# Patient Record
Sex: Female | Born: 2000 | Race: Black or African American | Hispanic: No | Marital: Single | Smoking: Never smoker
Health system: Southern US, Community
[De-identification: ages and names within clinical notes are randomized; demographics above are authoritative.]

## PROBLEM LIST (undated history)

## (undated) DIAGNOSIS — D649 Anemia, unspecified: Secondary | ICD-10-CM

## (undated) HISTORY — DX: Anemia, unspecified: D64.9

## (undated) HISTORY — PX: BREAST SURGERY: SHX581

---
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
## 2019-05-01 ENCOUNTER — Other Ambulatory Visit: Payer: Self-pay

## 2019-05-01 ENCOUNTER — Encounter: Payer: Self-pay | Admitting: Family Medicine

## 2019-05-01 ENCOUNTER — Ambulatory Visit (INDEPENDENT_AMBULATORY_CARE_PROVIDER_SITE_OTHER): Admitting: Family Medicine

## 2019-05-01 VITALS — BP 102/64 | HR 84 | Temp 98.8°F | Resp 16 | Ht 67.0 in | Wt 153.0 lb

## 2019-05-01 DIAGNOSIS — M542 Cervicalgia: Secondary | ICD-10-CM

## 2019-05-01 DIAGNOSIS — Z0000 Encounter for general adult medical examination without abnormal findings: Secondary | ICD-10-CM

## 2019-05-01 DIAGNOSIS — M545 Low back pain, unspecified: Secondary | ICD-10-CM

## 2019-05-01 DIAGNOSIS — N62 Hypertrophy of breast: Secondary | ICD-10-CM

## 2019-05-01 DIAGNOSIS — Z0001 Encounter for general adult medical examination with abnormal findings: Secondary | ICD-10-CM

## 2019-05-01 DIAGNOSIS — L700 Acne vulgaris: Secondary | ICD-10-CM

## 2019-05-01 DIAGNOSIS — Z23 Encounter for immunization: Secondary | ICD-10-CM | POA: Diagnosis not present

## 2019-05-01 DIAGNOSIS — G8929 Other chronic pain: Secondary | ICD-10-CM

## 2019-05-01 DIAGNOSIS — D509 Iron deficiency anemia, unspecified: Secondary | ICD-10-CM

## 2019-05-01 DIAGNOSIS — Z83438 Family history of other disorder of lipoprotein metabolism and other lipidemia: Secondary | ICD-10-CM | POA: Diagnosis not present

## 2019-05-01 MED ORDER — MULTI-VITAMIN/MINERALS PO TABS: 1.0000 | ORAL_TABLET | Freq: Every day | ORAL | Status: AC

## 2019-05-01 MED ORDER — D3-1000 25 MCG (1000 UT) PO TABS: 1000.0000 [IU] | ORAL_TABLET | Freq: Every day | ORAL | Status: AC

## 2019-05-01 MED ORDER — CLINDAMYCIN PHOS-BENZOYL PEROX 1-5 % EX GEL: Freq: Two times a day (BID) | CUTANEOUS | 0 refills | Status: DC

## 2019-05-01 MED ORDER — IRON (FERROUS SULFATE) 325 (65 FE) MG PO TABS: 1.0000 | ORAL_TABLET | Freq: Two times a day (BID) | ORAL | Status: AC

## 2019-05-01 NOTE — Patient Instructions (Signed)
Consultation with plastic surgery  We will call with lab results  Okay to continue the trainor and heating pad  Get xrays done at  [facility]  Dermatology referral Try the benzaclin for your skin  Flu shot given You are due for your 3rd HPV and Hep A ( You will need to start over the adult series which is 3 shots) You have 1 Hepatitis A in your system.  F/U pending results

## 2019-05-01 NOTE — Progress Notes (Signed)
Subjective:    Patient ID: Madison Livingston, female    DOB: [birth year], 18 y.o.   MRN: [id number]  Patient presents for Establish Care (is fasting) and Shoulder Pain (x years- intermittent B shoulder pain radiating down through lower back)  Pt here to re- establish care, she has not been seen in a few years.  She had CPE done at [facility] for college last year    Reviewed immunizations she is due for Hep A and her 3rd HPV   She is currently in 2nd year at [employer] [place] and [place] regular, not sexually active   Has dentist sees every 6 months   Exercies regulary   She has chronicshoulder pain and thoracic back pain, some low back pain for many years.  No known injury to her spine. She is used topical rubs she is to use antiflu amatory she is use heating pad stretching massage peppermint oil currently using a posture trainer which helps some with her low back pain.  she sues stretches, peppermint oil, heating pad  She does have a large bust area she gets indentations frm the bra 34DDD/F  She did have labral tear right side of right shoulder playing volleyball   Acne- a year or so, she had severe break out on her face,   she tried pinterest    she does get the large acne bumps before her period starts, otherwise more fine bumps on cheeks and chin  She has a few spots on her back  She has never tried anything prescription    She is concerned about anemia.  She was unable to donate blood they told her that her iron level is low.  She started taking iron on her own in about 2 weeks ago went up to 325 twice a day.  She does have family history of anemia in her sister.   Review Of Systems:  GEN- denies fatigue, fever, weight loss,weakness, recent illness HEENT- denies eye drainage, change in vision, nasal discharge, CVS- denies chest pain, palpitations RESP- denies SOB, cough, wheeze ABD- denies N/V, change in stools, abd pain GU- denies dysuria,  hematuria, dribbling, incontinence MSK- + joint pain, muscle aches, injury Neuro- denies headache, dizziness, syncope, seizure activity       Objective:    BP 102/64   Pulse 84   Temp 98.8 F (37.1 C) (Oral)   Resp 16   Ht 5\' 7"  (1.702 m)   Wt 153 lb (69.4 kg)   LMP 04/20/2019 Comment: regular  SpO2 98%   BMI 23.96 kg/m  GEN- NAD, alert and oriented x3 HEENT- PERRL, EOMI, non injected sclera, pink conjunctiva, MMM, oropharynx clear Neck- Supple, no thyromegaly CVS- RRR, no murmur RESP-CTAB ABD-NABS,soft,NT,ND Mild acne with hyperpigmented scarring on her cheeks Musculoskeletal good range of C-spine thoracic and lumbar spine.  Negative straight leg raise.  Tenderness to palpation along the cervical and thoracic spine.  Indentations in bilateral shoulders from her bra.  Larger bust size  Psych- normal affect and mood EXT- No edema Pulses- Radial 2+        Assessment & Plan:      Problem List Items Addressed This Visit      Unprioritized   Iron deficiency anemia   Relevant Medications   Iron, Ferrous Sulfate, 325 (65 Fe) MG TABS   Other Relevant Orders   CBC with Differential/Platelet   Iron, TIBC and Ferritin Panel    Other Visit Diagnoses    Routine  general medical examination at a health care facility    -  Primary   CPE done, check labs and iron stores, Flu shot given, discussed HEP A/HPV #3 due, with HEP A since she had 1, unless she is traveling out[place] of [place] does not need at this time She wanted to defer #3 HPV today   Relevant Orders   Comprehensive metabolic panel   Lipid panel   Family history of hyperlipidemia       Relevant Orders   Lipid panel   Chronic neck pain       Obtain xray of spine to f/u any spinal disorder of bone, I think her pain in general is from her larger bust size on her small frame, and I recommend consult plastics    Relevant Orders   DG Cervical Spine Complete   Chronic bilateral low back pain without sciatica        Relevant Orders   DG Lumbar Spine Complete   DG Thoracic Spine W/Swimmers   Large breasts       Relevant Orders   DG Cervical Spine Complete   DG Lumbar Spine Complete   Acne vulgaris       Benzclin given, referral to dermatology at request   Relevant Medications   clindamycin-benzoyl peroxide (BENZACLIN) gel   Other Relevant Orders   Ambulatory referral to Dermatology   Need for immunization against influenza       Relevant Orders   Flu Vaccine QUAD 36+ mos IM (Completed)      Note: This dictation was prepared with Dragon dictation along with smaller phrase technology. Any transcriptional errors that result from this process are unintentional.

## 2019-05-02 ENCOUNTER — Ambulatory Visit
Admission: RE | Admit: 2019-05-02 | Discharge: 2019-05-02 | Disposition: A | Discharge status: Home / Self Care | Source: Ambulatory Visit | Attending: Family Medicine | Admitting: Family Medicine

## 2019-05-02 DIAGNOSIS — G8929 Other chronic pain: Secondary | ICD-10-CM

## 2019-05-02 DIAGNOSIS — N62 Hypertrophy of breast: Secondary | ICD-10-CM

## 2019-05-02 DIAGNOSIS — M545 Low back pain, unspecified: Secondary | ICD-10-CM

## 2019-05-02 DIAGNOSIS — M542 Cervicalgia: Secondary | ICD-10-CM

## 2019-05-02 LAB — CBC WITH DIFFERENTIAL/PLATELET
Absolute Monocytes: 329 cells/uL (ref 200–950)
Basophils Absolute: 32 cells/uL (ref 0–200)
Basophils Relative: 0.7 %
Eosinophils Absolute: 108 cells/uL (ref 15–500)
Eosinophils Relative: 2.4 %
HCT: 42.3 % (ref 35.0–45.0)
Hemoglobin: 14.1 g/dL (ref 11.7–15.5)
Lymphs Abs: 2597 cells/uL (ref 850–3900)
MCH: 29.4 pg (ref 27.0–33.0)
MCHC: 33.3 g/dL (ref 32.0–36.0)
MCV: 88.1 fL (ref 80.0–100.0)
MPV: 12 fL (ref 7.5–12.5)
Monocytes Relative: 7.3 %
Neutro Abs: 1436 cells/uL — ABNORMAL LOW (ref 1500–7800)
Neutrophils Relative %: 31.9 %
Platelets: 251 10*3/uL (ref 140–400)
RBC: 4.8 10*6/uL (ref 3.80–5.10)
RDW: 11.8 % (ref 11.0–15.0)
Total Lymphocyte: 57.7 %
WBC: 4.5 10*3/uL (ref 3.8–10.8)

## 2019-05-02 LAB — IRON,TIBC AND FERRITIN PANEL
%SAT: 54 % (calc) — ABNORMAL HIGH (ref 15–45)
Ferritin: 43 ng/mL (ref 16–154)
Iron: 181 ug/dL — ABNORMAL HIGH (ref 27–164)
TIBC: 338 mcg/dL (calc) (ref 271–448)

## 2019-05-02 LAB — LIPID PANEL
Cholesterol: 200 mg/dL — ABNORMAL HIGH (ref <170)
HDL: 64 mg/dL (ref >45)
LDL Cholesterol (Calc): 120 mg/dL (calc) — ABNORMAL HIGH (ref <110)
Non-HDL Cholesterol (Calc): 136 mg/dL (calc) — ABNORMAL HIGH (ref <120)
Total CHOL/HDL Ratio: 3.1 (calc) (ref <5.0)
Triglycerides: 72 mg/dL (ref <90)

## 2019-05-02 LAB — COMPREHENSIVE METABOLIC PANEL
AG Ratio: 1.5 (calc) (ref 1.0–2.5)
ALT: 12 U/L (ref 5–32)
AST: 15 U/L (ref 12–32)
Albumin: 4.5 g/dL (ref 3.6–5.1)
Alkaline phosphatase (APISO): 46 U/L (ref 36–128)
BUN: 9 mg/dL (ref 7–20)
CO2: 27 mmol/L (ref 20–32)
Calcium: 10.2 mg/dL (ref 8.9–10.4)
Chloride: 103 mmol/L (ref 98–110)
Creat: 0.85 mg/dL (ref 0.50–1.00)
Globulin: 3.1 g/dL (calc) (ref 2.0–3.8)
Glucose, Bld: 83 mg/dL (ref 65–99)
Potassium: 4.2 mmol/L (ref 3.8–5.1)
Sodium: 140 mmol/L (ref 135–146)
Total Bilirubin: 0.4 mg/dL (ref 0.2–1.1)
Total Protein: 7.6 g/dL (ref 6.3–8.2)

## 2019-05-03 NOTE — Addendum Note (Signed)
Addended by: Vic Blackbird F on: 05/03/2019 04:20 PM   Modules accepted: Orders

## 2019-10-31 ENCOUNTER — Ambulatory Visit (INDEPENDENT_AMBULATORY_CARE_PROVIDER_SITE_OTHER): Admitting: Family Medicine

## 2019-10-31 ENCOUNTER — Other Ambulatory Visit: Payer: Self-pay

## 2019-10-31 VITALS — BP 118/80 | HR 94 | Temp 97.6°F | Resp 18 | Ht 67.0 in | Wt 143.4 lb

## 2019-10-31 DIAGNOSIS — N907 Vulvar cyst: Secondary | ICD-10-CM | POA: Diagnosis not present

## 2019-10-31 DIAGNOSIS — Z23 Encounter for immunization: Secondary | ICD-10-CM

## 2019-10-31 NOTE — Patient Instructions (Addendum)
Referral to GYN F/u as needed  [facility]

## 2019-10-31 NOTE — Progress Notes (Signed)
   Subjective:    Patient ID: Madison Livingston, female    DOB: January 11, 2001, 19 y.o.   MRN: [id number]  Patient presents for Mass (in groin area, x3 years, itches sometimes) Patient here with a knot in her vulvar region for years. He was seen by [facility] dermatology. Was told that she needed to have excised by GYN to come back to my office. They did give her clindamycin topical solution which she has been using. It was recently flared up but the solution did help. She has had some drainage on and off from it when it flares up it is quite tender especially when it rubs near the panty line. No lesions on her skin. Currently it has not flared up.    Review Of Systems:  GEN- denies fatigue, fever, weight loss,weakness, recent illness HEENT- denies eye drainage, change in vision, nasal discharge, CVS- denies chest pain, palpitations RESP- denies SOB, cough, wheeze ABD- denies N/V, change in stools, abd pain GU- denies dysuria, hematuria, dribbling, incontinence MSK- denies joint pain, muscle aches, injury Neuro- denies headache, dizziness, syncope, seizure activity       Objective:    BP 118/80 (BP Location: Right Arm, Patient Position: Sitting, Cuff Size: Normal)   Pulse 94   Temp 97.6 F (36.4 C) (Temporal)   Resp 18   Ht 5\' 7"  (1.702 m)   Wt 143 lb 6.4 oz (65 kg)   SpO2 99%   BMI 22.46 kg/m  GEN- NAD, alert and oriented x3 Neck- Supple, no thyromegaly CVS- RRR, no murmur RESP-CTAB Skin- right labial- small indurated circular area palpated, NT, no fluctuance        Assessment & Plan:      Problem List Items Addressed This Visit    None    Visit Diagnoses    Labial cyst    -  Primary   Small labial cyst that keeps getting inflamed. Will defer to GYN due to location. She can continue topical clindamycin she would like to have removed    Relevant Orders   Ambulatory referral to Obstetrics / Gynecology   Need for vaccination       Relevant Orders   HPV 9-valent  vaccine,Recombinat (Completed)      Note: This dictation was prepared with Dragon dictation along with smaller phrase technology. Any transcriptional errors that result from this process are unintentional.

## 2019-11-01 ENCOUNTER — Encounter: Payer: Self-pay | Admitting: Family Medicine

## 2020-07-24 ENCOUNTER — Ambulatory Visit (INDEPENDENT_AMBULATORY_CARE_PROVIDER_SITE_OTHER): Admitting: Family Medicine

## 2020-07-24 ENCOUNTER — Other Ambulatory Visit: Payer: Self-pay

## 2020-07-24 ENCOUNTER — Encounter: Payer: Self-pay | Admitting: Family Medicine

## 2020-07-24 VITALS — BP 102/60 | HR 98 | Temp 98.2°F | Resp 14 | Ht 67.0 in | Wt 140.0 lb

## 2020-07-24 DIAGNOSIS — Z113 Encounter for screening for infections with a predominantly sexual mode of transmission: Secondary | ICD-10-CM | POA: Diagnosis not present

## 2020-07-24 DIAGNOSIS — R634 Abnormal weight loss: Secondary | ICD-10-CM

## 2020-07-24 DIAGNOSIS — F439 Reaction to severe stress, unspecified: Secondary | ICD-10-CM

## 2020-07-24 DIAGNOSIS — Z0001 Encounter for general adult medical examination with abnormal findings: Secondary | ICD-10-CM | POA: Diagnosis not present

## 2020-07-24 DIAGNOSIS — Z0000 Encounter for general adult medical examination without abnormal findings: Secondary | ICD-10-CM

## 2020-07-24 DIAGNOSIS — D509 Iron deficiency anemia, unspecified: Secondary | ICD-10-CM

## 2020-07-24 NOTE — Patient Instructions (Addendum)
Travel clinic info [phone] We will call with lab results F/U 1 year for Physical

## 2020-07-24 NOTE — Progress Notes (Signed)
Subjective:    Patient ID: Madison Livingston, female    DOB: Mar 12, 2001, 20 y.o.   MRN: [id number]  Patient presents for Annual Exam (Is not fasting/)  Patient here for complete physical exam medications reviewed.  Family history reviewed  Menses regular now, but it was irregular when her weight was done   Immunizations- UTD including flu/covid   She is currently in 3rd year at [employer], [employer]  and [employer] studies  History of iron def anemia  She has had bilat breast reduction , following with Dr. Onalee Hua,- plastic surgery    Mildly elevated CHolesterol in  2020 due for repeat   She admits she was losing weight during the school semesster, she was not prioritizing eating.  States that at times she is very stressed especially since she was at home for most of her second year because of Covid and then returned to campus this past fall.  She also states that she lives off campus and only had her meal plan so she had to drive about 40 minutes just to go eat and often she did not want to do that so she would eat random things such as popcorn or Ramen noodles and not have a proper meal.  She states her weight got down to 133 pounds and that she did not look very thin and sickly.  She would occasionally have abdominal discomfort if she ate a lot but this was not a regular issue.  When she came home for the holiday break she started eating more and eating regularly as her family was concerned states her weight is already gone up 7 pounds.  She does feel like now she has a better handle on the stressors we discussed psychotherapy if she ever thought about reaching out for therapy and she states she was reluctant to do so.   Sexually active- non penetrative 1 partner  Review Of Systems:  GEN- denies fatigue, fever, weight loss,weakness, recent illness HEENT- denies eye drainage, change in vision, nasal discharge, CVS- denies chest pain, palpitations RESP- denies SOB,  cough, wheeze ABD- denies N/V, change in stools, abd pain GU- denies dysuria, hematuria, dribbling, incontinence MSK- denies joint pain, muscle aches, injury Neuro- denies headache, dizziness, syncope, seizure activity       Objective:    BP 102/60   Pulse 98   Temp 98.2 F (36.8 C) (Temporal)   Resp 14   Ht 5\' 7"  (1.702 m)   Wt 140 lb (63.5 kg)   LMP 07/16/2020 Comment: regular  SpO2 100%   BMI 21.93 kg/m  GEN- NAD, alert and oriented x3 HEENT- PERRL, EOMI, non injected sclera, pink conjunctiva, MMM, oropharynx clear Neck- Supple, no thyromegaly CVS- RRR, no murmur RESP-CTAB ABD-NABS,soft,NT,ND Psych normal affect and mood  EXT- No edema Pulses- Radial, DP- 2+        Assessment & Plan:      Problem List Items Addressed This Visit      Unprioritized   Iron deficiency anemia   Relevant Orders   Iron   Ferritin    Other Visit Diagnoses    Routine general medical examination at a health care facility    -  Primary   CPE done, PAP to be done at next visit, pt not prepared today. STD done off urine sample and bloodwork. Immunizations UTD    Relevant Orders   CBC with Differential/Platelet   Comprehensive metabolic panel   Lipid panel   Unintentional weight loss  I think this was due to malnutrition, stressors with college/Pandemic. will check labs, continue vitamins/iron   Relevant Orders   TSH   Screen for STD (sexually transmitted disease)       Relevant Orders   HIV Antibody (routine testing w rflx)   Hepatitis C antibody   Trichomonas vaginalis, RNA   RPR   C. trachomatis/N. gonorrhoeae RNA   Situational stress       Discussed therapy benefits, she will consider, at this time feels much better, weight also improved,       Note: This dictation was prepared with Dragon dictation along with smaller phrase technology. Any transcriptional errors that result from this process are unintentional.

## 2020-07-25 LAB — CBC WITH DIFFERENTIAL/PLATELET
Absolute Monocytes: 263 cells/uL (ref 200–950)
Basophils Absolute: 29 cells/uL (ref 0–200)
Basophils Relative: 0.8 %
Eosinophils Absolute: 112 cells/uL (ref 15–500)
Eosinophils Relative: 3.1 %
HCT: 41.6 % (ref 35.0–45.0)
Hemoglobin: 13.9 g/dL (ref 11.7–15.5)
Lymphs Abs: 2509 cells/uL (ref 850–3900)
MCH: 28.8 pg (ref 27.0–33.0)
MCHC: 33.4 g/dL (ref 32.0–36.0)
MCV: 86.1 fL (ref 80.0–100.0)
MPV: 11.1 fL (ref 7.5–12.5)
Monocytes Relative: 7.3 %
Neutro Abs: 688 cells/uL — ABNORMAL LOW (ref 1500–7800)
Neutrophils Relative %: 19.1 %
Platelets: 257 10*3/uL (ref 140–400)
RBC: 4.83 10*6/uL (ref 3.80–5.10)
RDW: 11.7 % (ref 11.0–15.0)
Total Lymphocyte: 69.7 %
WBC: 3.6 10*3/uL — ABNORMAL LOW (ref 3.8–10.8)

## 2020-07-25 LAB — LIPID PANEL
Cholesterol: 203 mg/dL — ABNORMAL HIGH (ref <200)
HDL: 72 mg/dL (ref >=50)
LDL Cholesterol (Calc): 116 mg/dL (calc) — ABNORMAL HIGH
Non-HDL Cholesterol (Calc): 131 mg/dL (calc) — ABNORMAL HIGH (ref <130)
Total CHOL/HDL Ratio: 2.8 (calc) (ref <5.0)
Triglycerides: 55 mg/dL (ref <150)

## 2020-07-25 LAB — HIV ANTIBODY (ROUTINE TESTING W REFLEX): HIV 1&2 Ab, 4th Generation: NONREACTIVE

## 2020-07-25 LAB — COMPREHENSIVE METABOLIC PANEL
AG Ratio: 1.5 (calc) (ref 1.0–2.5)
ALT: 17 U/L (ref 6–29)
AST: 18 U/L (ref 10–30)
Albumin: 4.6 g/dL (ref 3.6–5.1)
Alkaline phosphatase (APISO): 37 U/L (ref 31–125)
BUN: 11 mg/dL (ref 7–25)
CO2: 24 mmol/L (ref 20–32)
Calcium: 9.9 mg/dL (ref 8.6–10.2)
Chloride: 101 mmol/L (ref 98–110)
Creat: 0.83 mg/dL (ref 0.50–1.10)
Globulin: 3 g/dL (calc) (ref 1.9–3.7)
Glucose, Bld: 66 mg/dL (ref 65–99)
Potassium: 4.4 mmol/L (ref 3.5–5.3)
Sodium: 139 mmol/L (ref 135–146)
Total Bilirubin: 0.7 mg/dL (ref 0.2–1.2)
Total Protein: 7.6 g/dL (ref 6.1–8.1)

## 2020-07-25 LAB — C. TRACHOMATIS/N. GONORRHOEAE RNA
C. trachomatis RNA, TMA: NOT DETECTED
N. gonorrhoeae RNA, TMA: NOT DETECTED

## 2020-07-25 LAB — IRON: Iron: 127 ug/dL (ref 40–190)

## 2020-07-25 LAB — RPR: RPR Ser Ql: NONREACTIVE

## 2020-07-25 LAB — TSH: TSH: 2.17 mIU/L

## 2020-07-25 LAB — HEPATITIS C ANTIBODY
Hepatitis C Ab: NONREACTIVE
SIGNAL TO CUT-OFF: 0.03 (ref <1.00)

## 2020-07-25 LAB — FERRITIN: Ferritin: 26 ng/mL (ref 16–154)

## 2020-07-27 LAB — TRICHOMONAS VAGINALIS, PROBE AMP: Trichomonas vaginalis RNA: NOT DETECTED

## 2020-11-05 ENCOUNTER — Telehealth: Payer: Self-pay | Admitting: Family Medicine

## 2020-11-05 NOTE — Telephone Encounter (Signed)
Patient returned call.   Requested copy of immunizations be sent to her.   E-mailed copy.

## 2020-11-05 NOTE — Telephone Encounter (Signed)
Call placed to patient. LMTRC.  

## 2020-11-05 NOTE — Telephone Encounter (Signed)
Patient called with questions about vaccines and records on file. Please advise at [phone]; traveling out of the country next [place].

## 2021-01-17 IMAGING — CR DG THORACIC SPINE 3V
3 series · 3 of 3 positions shown · non-contrast
Comparison: None.

CLINICAL DATA: Dorsalgia

EXAM:
THORACIC SPINE - 3 VIEWS

[t t-spine a.p. *]
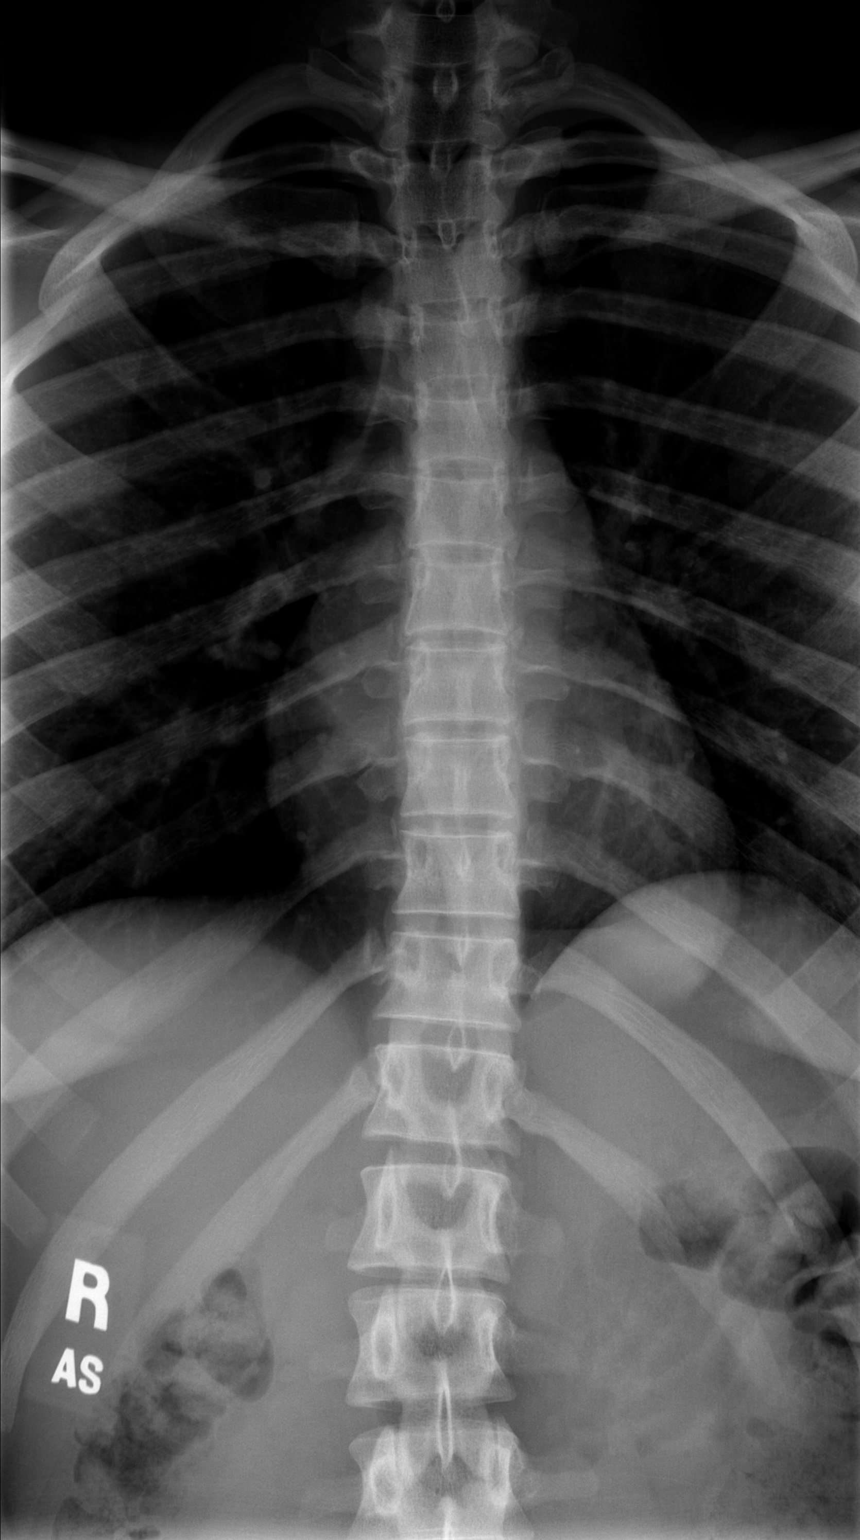

[t t-spine lat *]
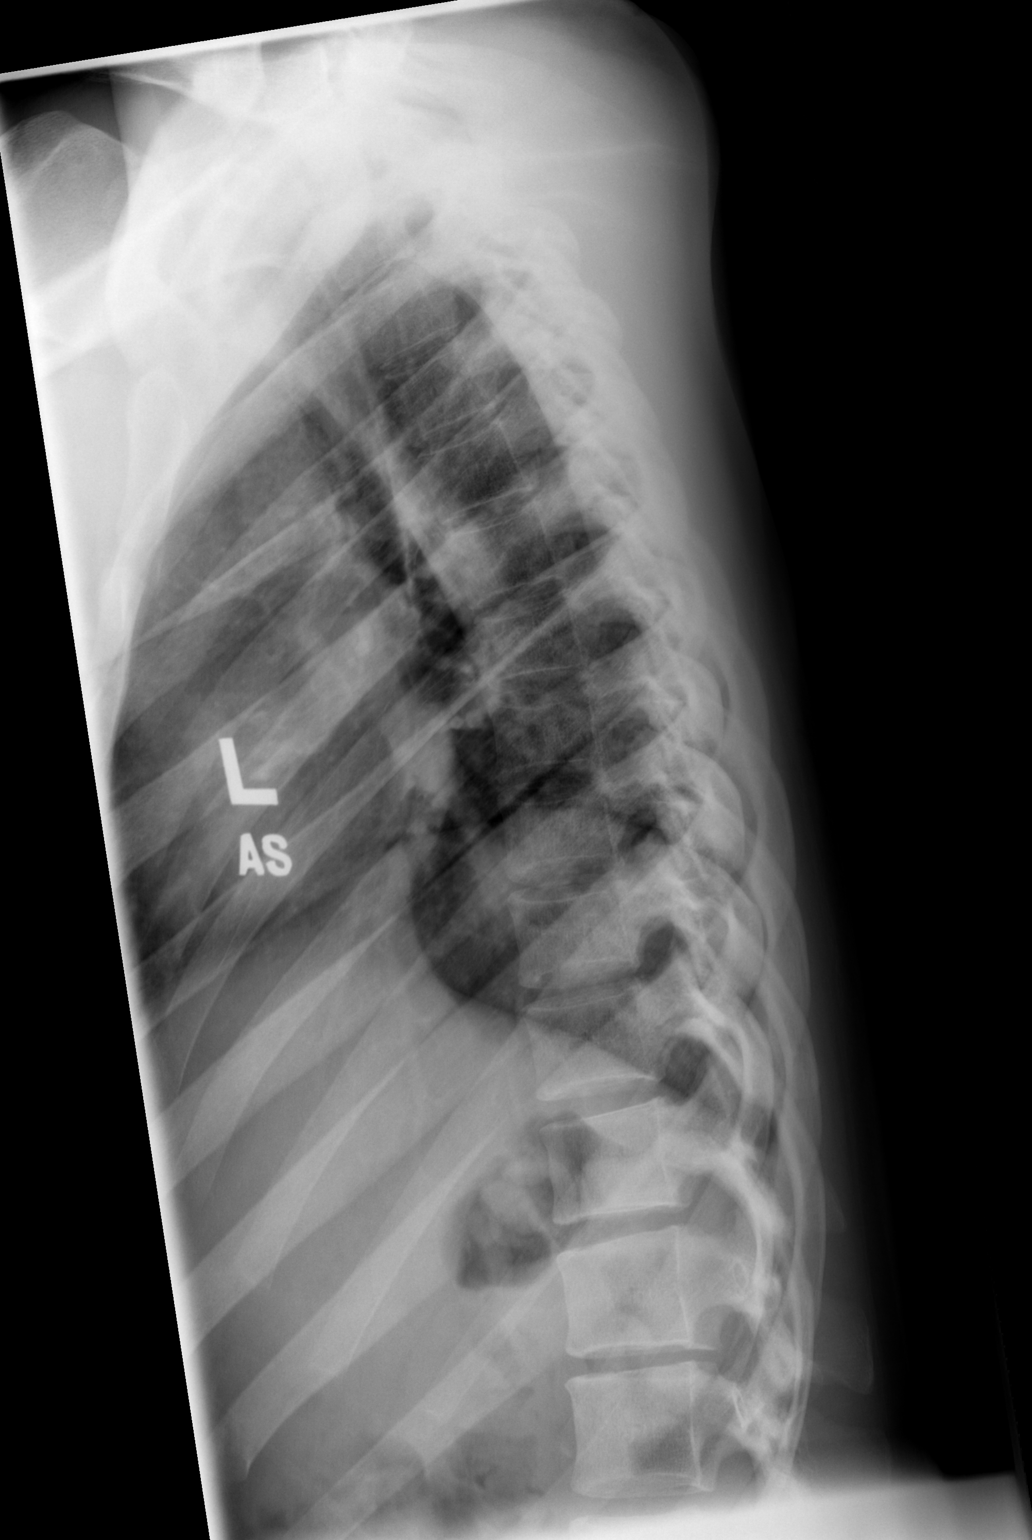

[t swimmers]
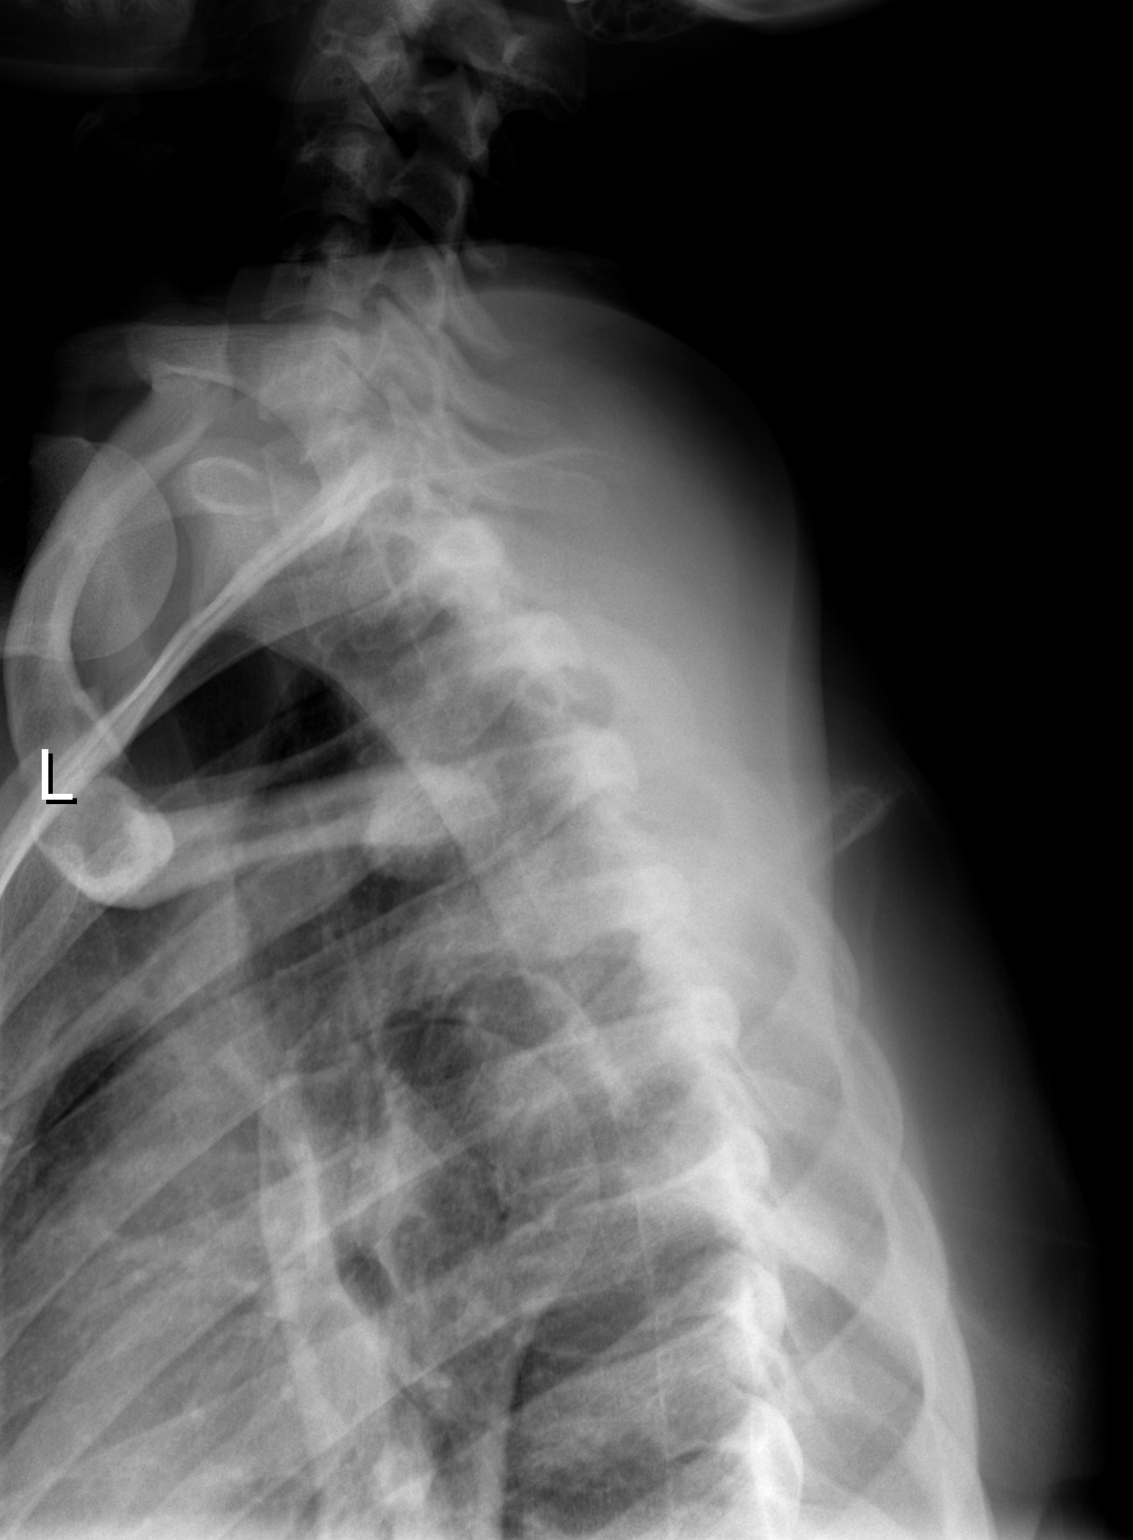

[3 of 3 positions shown; findings below may reference images not displayed]

FINDINGS: Frontal, lateral, and swimmer's views were obtained. No fracture or
spondylolisthesis. There is mild thoracolumbar dextroscoliosis. Disc
spaces appear normal. No erosive change or paraspinous lesions.
Visualized lungs clear.
IMPRESSION: Mild thoracolumbar dextroscoliosis. No fracture or
spondylolisthesis. No appreciable arthropathic change.

## 2021-01-17 IMAGING — CR DG LUMBAR SPINE COMPLETE 4+V
5 series · 5 of 5 positions shown · non-contrast
Comparison: None.

CLINICAL DATA: Chronic low back pain

EXAM:
LUMBAR SPINE - COMPLETE 4+ VIEW

[t l-spine a.p.]
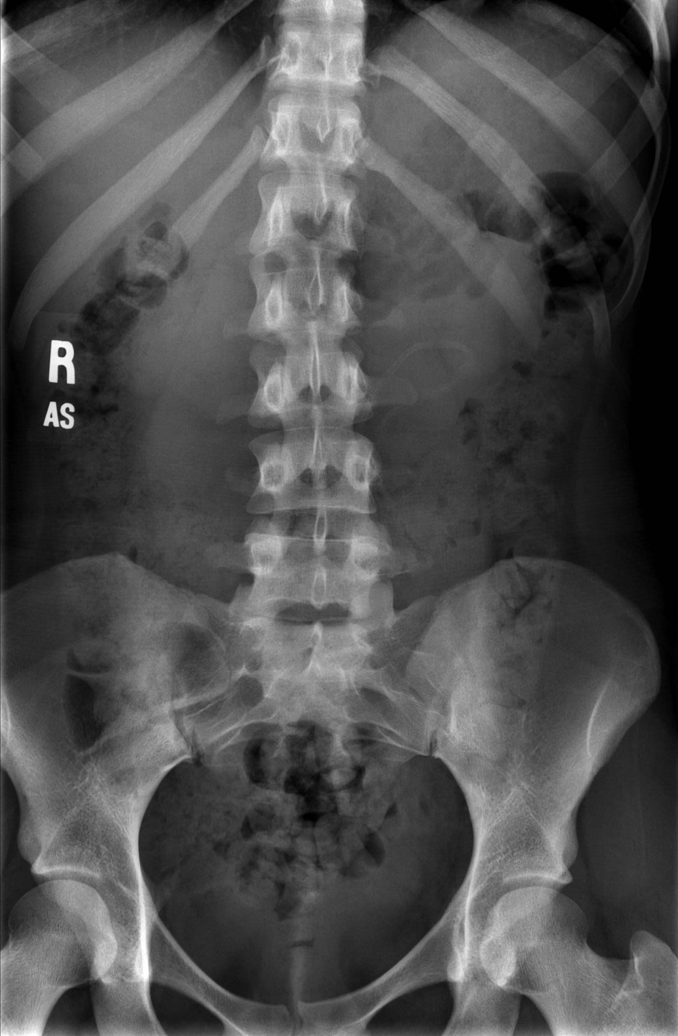

[t l-spine oblique exposure (1 of 2)]
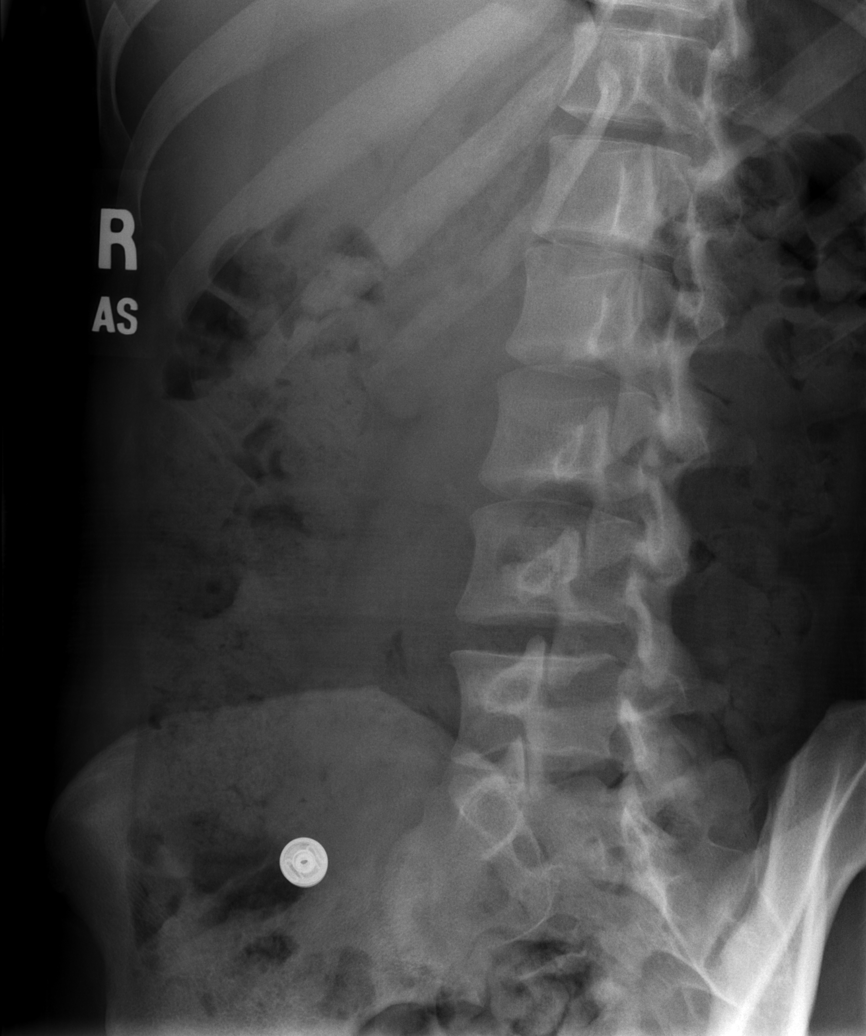

[t l-spine oblique exposure (2 of 2)]
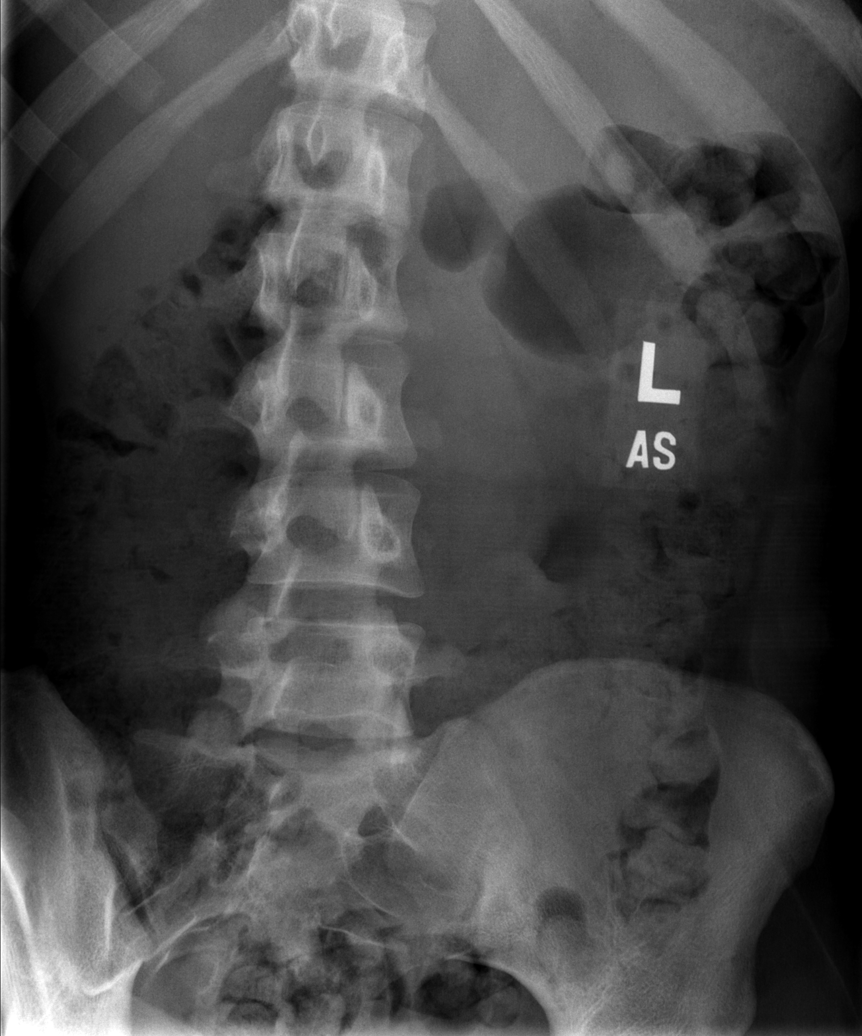

[t l-spine lat]
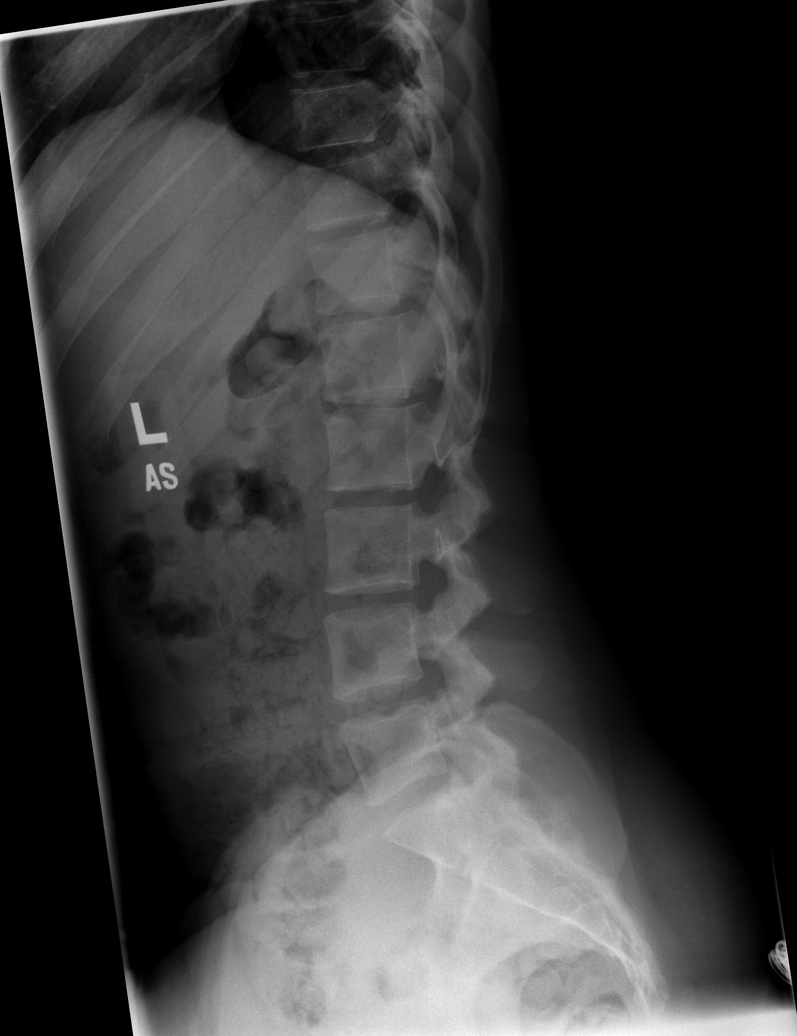

[t l-spine l5-s1 spot]
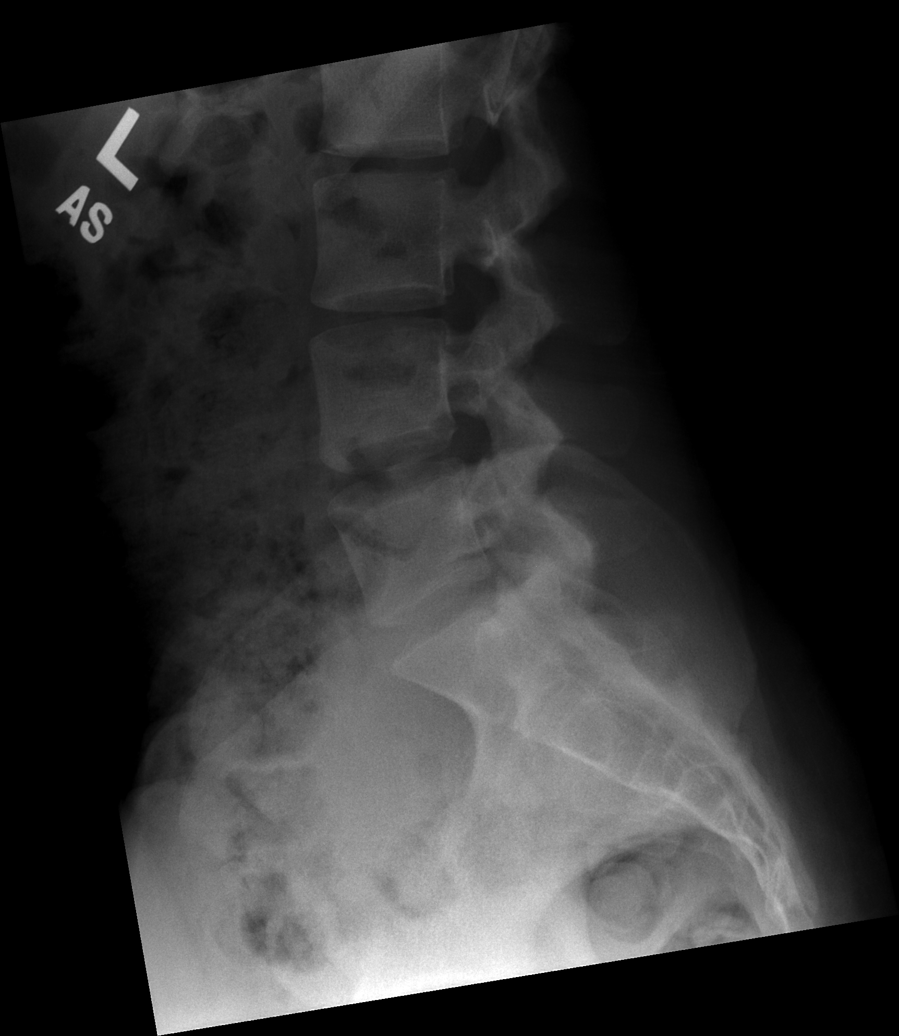

[5 of 5 positions shown; findings below may reference images not displayed]

FINDINGS: Frontal, lateral, spot lumbosacral lateral, and bilateral oblique
views were obtained. There are 5 non-rib-bearing lumbar type
vertebral bodies. There is slight thoracolumbar dextroscoliosis.
There is no fracture or spondylolisthesis. The disc spaces appear
unremarkable. There is no appreciable facet arthropathy.
IMPRESSION: Slight thoracolumbar dextroscoliosis. No fracture or
spondylolisthesis. No appreciable arthropathy.
